# Patient Record
Sex: Male | Born: 1975 | Race: Black or African American | Hispanic: No | Marital: Married | Smoking: Never smoker
Health system: Southern US, Community
[De-identification: ages and names within clinical notes are randomized; demographics above are authoritative.]

## PROBLEM LIST (undated history)

## (undated) DIAGNOSIS — J45909 Unspecified asthma, uncomplicated: Secondary | ICD-10-CM

---

## 2016-02-29 ENCOUNTER — Encounter (HOSPITAL_COMMUNITY): Payer: Self-pay | Admitting: *Deleted

## 2016-02-29 ENCOUNTER — Emergency Department (HOSPITAL_COMMUNITY)
Admission: EM | Admit: 2016-02-29 | Discharge: 2016-02-29 | Disposition: A | Discharge status: Home / Self Care | Payer: Medicaid Other | Attending: Emergency Medicine | Admitting: Emergency Medicine

## 2016-02-29 DIAGNOSIS — J3489 Other specified disorders of nose and nasal sinuses: Secondary | ICD-10-CM | POA: Diagnosis present

## 2016-02-29 DIAGNOSIS — R197 Diarrhea, unspecified: Secondary | ICD-10-CM | POA: Diagnosis not present

## 2016-02-29 DIAGNOSIS — R42 Dizziness and giddiness: Secondary | ICD-10-CM | POA: Insufficient documentation

## 2016-02-29 DIAGNOSIS — J019 Acute sinusitis, unspecified: Secondary | ICD-10-CM | POA: Insufficient documentation

## 2016-02-29 DIAGNOSIS — J45909 Unspecified asthma, uncomplicated: Secondary | ICD-10-CM | POA: Diagnosis not present

## 2016-02-29 DIAGNOSIS — R110 Nausea: Secondary | ICD-10-CM | POA: Diagnosis not present

## 2016-02-29 HISTORY — DX: Unspecified asthma, uncomplicated: J45.909

## 2016-02-29 MED ORDER — IBUPROFEN 800 MG PO TABS
800.0000 mg | ORAL_TABLET | Freq: Once | ORAL | Status: AC
  Administered 2016-02-29: 800 mg via ORAL
  Filled 2016-02-29: qty 1

## 2016-02-29 MED ORDER — ONDANSETRON 4 MG PO TBDP
4.0000 mg | ORAL_TABLET | Freq: Once | ORAL | Status: AC
  Administered 2016-02-29: 4 mg via ORAL
  Filled 2016-02-29: qty 1

## 2016-02-29 MED ORDER — DEXAMETHASONE 4 MG PO TABS
10.0000 mg | ORAL_TABLET | Freq: Once | ORAL | Status: AC
  Administered 2016-02-29: 10 mg via ORAL
  Filled 2016-02-29: qty 3

## 2016-02-29 MED ORDER — PROMETHAZINE HCL 25 MG PO TABS: 25.0000 mg | ORAL_TABLET | Freq: Four times a day (QID) | ORAL | Status: DC | PRN

## 2016-02-29 MED ORDER — FLUTICASONE PROPIONATE 50 MCG/ACT NA SUSP: 2.0000 | Freq: Every day | NASAL | Status: AC

## 2016-02-29 MED ORDER — IBUPROFEN 800 MG PO TABS: 800.0000 mg | ORAL_TABLET | Freq: Three times a day (TID) | ORAL | Status: DC | PRN

## 2016-02-29 MED ORDER — CETIRIZINE HCL 10 MG PO CAPS: 10.0000 mg | ORAL_CAPSULE | Freq: Every day | ORAL | Status: AC

## 2016-02-29 NOTE — ED Notes (Signed)
Sinus pressure eyes and ears draining today  From the pollen  He has had benadryl

## 2016-02-29 NOTE — ED Provider Notes (Signed)
By signing my name below, I, Budd Palmer, attest that this documentation has been prepared under the direction and in the presence of [employer], DO. Electronically Signed: Budd Palmer, ED Scribe. 02/29/2016. 3:47 AM.  TIME SEEN: 3:40 AM  CHIEF COMPLAINT: Seasonal Allergies  HPI: Christian Ellis is a 40 y.o. male with a PMHx of asthma who presents to the Emergency Department complaining of seasonal allergies flare up onset today. He reports associated nausea, diarrhea, and sinus pressure above the eyes, as well as dizziness. He also endorses increased discharge from the eyes, as well as rhinorrhea, and ear drainage. He states he typically takes sinus drainage pills for this, but notes that this has not provided any relief. He has also taken a daytime benadryl today without any effect. He is not on any daily medication for his sinuses. Pt denies vomiting, fever, and chills. No neck pain or neck stiffness. No rash. No shortness of breath. No cough.  ROS: See HPI Constitutional: no fever  Eyes: drainage  ENT: runny nose   Cardiovascular:  no chest pain  Resp: no SOB  GI: no vomiting GU: no dysuria Integumentary: no rash  Allergy: no hives  Musculoskeletal: no leg swelling  Neurological: no slurred speech ROS otherwise negative  PAST MEDICAL HISTORY/PAST SURGICAL HISTORY:  Past Medical History  Diagnosis Date  . Asthma     MEDICATIONS:  Prior to Admission medications   Not on File    ALLERGIES:  No Known Allergies  SOCIAL HISTORY:  Social History  Substance Use Topics  . Smoking status: Never Smoker   . Smokeless tobacco: Not on file  . Alcohol Use: No    FAMILY HISTORY: No family history on file.  EXAM: BP 132/94 mmHg  Pulse 100  Temp(Src) 100 F (37.8 C)  Resp 16  Ht 5' 10.5" (1.791 m)  Wt 231 lb 3 oz (104.866 kg)  BMI 32.69 kg/m2  SpO2 95% CONSTITUTIONAL: Alert and oriented and responds appropriately to questions. Well-appearing; well-nourished,  Low-grade temperature of 100, nontoxic, smiling, pleasant HEAD: Normocephalic EYES: Conjunctivae clear, PERRL ENT: normal nose; no rhinorrhea; moist mucous membranes; No pharyngeal erythema or petechiae, no tonsillar hypertrophy or exudate, no uvular deviation, no trismus or drooling, normal phonation, no stridor, no dental caries or abscess noted, no Ludwig's angina, tongue sits flat in the bottom of the mouth; TMs are clear bilaterally without erythema, purulence, bulging, perforation, effusion.  No cerumen impaction or sign of foreign body in the external auditory canal. No inflammation, erythema or drainage from the external auditory canal. No signs of mastoiditis. No pain with manipulation of the pinna bilaterally. NECK: Supple, no meningismus, no LAD  CARD: RRR; S1 and S2 appreciated; no murmurs, no clicks, no rubs, no gallops RESP: Normal chest excursion without splinting or tachypnea; breath sounds clear and equal bilaterally; no wheezes, no rhonchi, no rales, no hypoxia or respiratory distress, speaking full sentences ABD/GI: Normal bowel sounds; non-distended; soft, non-tender, no rebound, no guarding, no peritoneal signs BACK:  The back appears normal and is non-tender to palpation, there is no CVA tenderness EXT: Normal ROM in all joints; non-tender to palpation; no edema; normal capillary refill; no cyanosis, no calf tenderness or swelling    SKIN: Normal color for age and race; warm; no rash NEURO: Moves all extremities equally, sensation to light touch intact diffusely, cranial nerves II through XII intact PSYCH: The patient's mood and manner are appropriate. Grooming and personal hygiene are appropriate.  MEDICAL DECISION MAKING: Patient here  with sinusitis versus upper respiratory infection. No sign of bacterial infection on exam. Doubt pneumonia or meningitis. No sign of deep space neck infection. No sign of pharyngitis, otitis media. I do not feel he needs to be on antibiotics. We'll  discharge him with medication for symptomatic relief. Given Decadron in the emergency department. We'll discharge with ibuprofen, Phenergan, Flonase, Zyrtec. Advised him to continue using Benadryl as needed. He reports this frequent happens to him with there is increase of pollen. Abdominal exam is benign. He is having nausea and diarrhea but appears well hydrated. Doubt appendicitis, colitis, bowel obstruction, diverticulitis, pancreatitis, cholecystitis. Discussed return precautions. Given outpatient PCP follow-up information.   At this time, I do not feel there is any life-threatening condition present. I have reviewed and discussed all results (EKG, imaging, lab, urine as appropriate), exam findings with patient. I have reviewed nursing notes and appropriate previous records.  I feel the patient is safe to be discharged home without further emergent workup. Discussed usual and customary return precautions. Patient and family (if present) verbalize understanding and are comfortable with this plan.  Patient will follow-up with their primary care provider. If they do not have a primary care provider, information for follow-up has been provided to them. All questions have been answered.  I personally performed the services described in this documentation, which was scribed in my presence. The recorded information has been reviewed and is accurate.   Layla MawKristen N Kista Robb, DO 02/29/16 [phone]

## 2016-02-29 NOTE — Discharge Instructions (Signed)
If you develop fever of 100.4 higher you may alternate between Tylenol 1000 mg every 6 hours as needed for fever and pain and ibuprofen 800 mg every 8 hours as needed for fever and pain. Your symptoms today may be secondary to seasonal allergies versus the beginning of a viral illness. Viruses do not need antibiotics. A viral illness may take 7-10 days to run its course. I recommend increased rest, fluid intake.  Sinusitis, Adult Sinusitis is redness, soreness, and inflammation of the paranasal sinuses. Paranasal sinuses are air pockets within the bones of your face. They are located beneath your eyes, in the middle of your forehead, and above your eyes. In healthy paranasal sinuses, mucus is able to drain out, and air is able to circulate through them by way of your nose. However, when your paranasal sinuses are inflamed, mucus and air can become trapped. This can allow bacteria and other germs to grow and cause infection. Sinusitis can develop quickly and last only a short time (acute) or continue over a long period (chronic). Sinusitis that lasts for more than 12 weeks is considered chronic. CAUSES Causes of sinusitis include:  Allergies.  Structural abnormalities, such as displacement of the cartilage that separates your nostrils (deviated septum), which can decrease the air flow through your nose and sinuses and affect sinus drainage.  Functional abnormalities, such as when the small hairs (cilia) that line your sinuses and help remove mucus do not work properly or are not present. SIGNS AND SYMPTOMS Symptoms of acute and chronic sinusitis are the same. The primary symptoms are pain and pressure around the affected sinuses. Other symptoms include:  Upper toothache.  Earache.  Headache.  Bad breath.  Decreased sense of smell and taste.  A cough, which worsens when you are lying flat.  Fatigue.  Fever.  Thick drainage from your nose, which often is green and may contain pus  (purulent).  Swelling and warmth over the affected sinuses. DIAGNOSIS Your health care provider will perform a physical exam. During your exam, your health care provider may perform any of the following to help determine if you have acute sinusitis or chronic sinusitis:  Look in your nose for signs of abnormal growths in your nostrils (nasal polyps).  Tap over the affected sinus to check for signs of infection.  View the inside of your sinuses using an imaging device that has a light attached (endoscope). If your health care provider suspects that you have chronic sinusitis, one or more of the following tests may be recommended:  Allergy tests.  Nasal culture. A sample of mucus is taken from your nose, sent to a lab, and screened for bacteria.  Nasal cytology. A sample of mucus is taken from your nose and examined by your health care provider to determine if your sinusitis is related to an allergy. TREATMENT Most cases of acute sinusitis are related to a viral infection and will resolve on their own within 10 days. Sometimes, medicines are prescribed to help relieve symptoms of both acute and chronic sinusitis. These may include pain medicines, decongestants, nasal steroid sprays, or saline sprays. However, for sinusitis related to a bacterial infection, your health care provider will prescribe antibiotic medicines. These are medicines that will help kill the bacteria causing the infection. Rarely, sinusitis is caused by a fungal infection. In these cases, your health care provider will prescribe antifungal medicine. For some cases of chronic sinusitis, surgery is needed. Generally, these are cases in which sinusitis recurs more than 3  times per year, despite other treatments. HOME CARE INSTRUCTIONS  Drink plenty of water. Water helps thin the mucus so your sinuses can drain more easily.  Use a humidifier.  Inhale steam 3-4 times a day (for example, sit in the bathroom with the shower  running).  Apply a warm, moist washcloth to your face 3-4 times a day, or as directed by your health care provider.  Use saline nasal sprays to help moisten and clean your sinuses.  Take medicines only as directed by your health care provider.  If you were prescribed either an antibiotic or antifungal medicine, finish it all even if you start to feel better. SEEK IMMEDIATE MEDICAL CARE IF:  You have increasing pain or severe headaches.  You have nausea, vomiting, or drowsiness.  You have swelling around your face.  You have vision problems.  You have a stiff neck.  You have difficulty breathing.   This information is not intended to replace advice given to you by your health care provider. Make sure you discuss any questions you have with your health care provider.   Document Released: 11/10/2005 Document Revised: 12/01/2014 Document Reviewed: 11/25/2011 Elsevier Interactive Patient Education [employer]2016 Elsevier Inc.   To find a primary care or specialty doctor please call [phone] or [phone] to access "[place] Find a Doctor Service."  You may also go on the [facility] website at [email or web address]  There are also multiple [facility] practices throughout the [place] that are frequently accepting new patients. You may find a clinic that is close to your home and contact them.  [facility] -  [address] [place] [id number] [phone]  [facility] in [place] (also locations in [place] and [place]) -  [address] [place] [address] [phone]  [facility] -  [address] [place] [address] [phone]

## 2016-04-16 ENCOUNTER — Emergency Department (HOSPITAL_COMMUNITY)
Admission: EM | Admit: 2016-04-16 | Discharge: 2016-04-16 | Disposition: A | Discharge status: Home / Self Care | Payer: Medicaid Other | Attending: Emergency Medicine | Admitting: Emergency Medicine

## 2016-04-16 ENCOUNTER — Encounter (HOSPITAL_COMMUNITY): Payer: Self-pay | Admitting: Emergency Medicine

## 2016-04-16 ENCOUNTER — Emergency Department (HOSPITAL_COMMUNITY): Payer: Medicaid Other

## 2016-04-16 DIAGNOSIS — Z79899 Other long term (current) drug therapy: Secondary | ICD-10-CM | POA: Diagnosis not present

## 2016-04-16 DIAGNOSIS — Z791 Long term (current) use of non-steroidal anti-inflammatories (NSAID): Secondary | ICD-10-CM | POA: Diagnosis not present

## 2016-04-16 DIAGNOSIS — J189 Pneumonia, unspecified organism: Secondary | ICD-10-CM | POA: Insufficient documentation

## 2016-04-16 DIAGNOSIS — J45909 Unspecified asthma, uncomplicated: Secondary | ICD-10-CM | POA: Diagnosis not present

## 2016-04-16 DIAGNOSIS — R05 Cough: Secondary | ICD-10-CM | POA: Diagnosis present

## 2016-04-16 MED ORDER — BENZONATATE 100 MG PO CAPS: 100.0000 mg | ORAL_CAPSULE | Freq: Three times a day (TID) | ORAL | Status: AC

## 2016-04-16 MED ORDER — AZITHROMYCIN 250 MG PO TABS: 250.0000 mg | ORAL_TABLET | Freq: Every day | ORAL | Status: AC

## 2016-04-16 MED ORDER — ALBUTEROL SULFATE HFA 108 (90 BASE) MCG/ACT IN AERS: 1.0000 | INHALATION_SPRAY | Freq: Four times a day (QID) | RESPIRATORY_TRACT | Status: AC | PRN

## 2016-04-16 MED ORDER — OXYCODONE-ACETAMINOPHEN 5-325 MG PO TABS
1.0000 | ORAL_TABLET | Freq: Once | ORAL | Status: AC
  Administered 2016-04-16: 1 via ORAL
  Filled 2016-04-16: qty 1

## 2016-04-16 MED ORDER — IBUPROFEN 800 MG PO TABS: 800.0000 mg | ORAL_TABLET | Freq: Three times a day (TID) | ORAL | Status: AC

## 2016-04-16 NOTE — ED Notes (Addendum)
cough and back pain started 2 days ago congested cough and hurts to breath and cough states son and wife have had resp bugs

## 2016-04-16 NOTE — ED Provider Notes (Signed)
CSN: [id number]     Arrival date & time 04/16/16  0815 History   First MD Initiated Contact with Patient 04/16/16 0825     Chief Complaint  Patient presents with  . Cough     (Consider location/radiation/quality/duration/timing/severity/associated sxs/prior Treatment) Patient is a 40 y.o. male presenting with cough. The history is provided by the patient and medical records.  Cough   40 year old male with history of asthma, presenting to the ED for cough. He reports this began 2 days ago. Has overall been dry and nonproductive. He states he has pain in his left ribs with coughing and deep breathing. He reports he had a fever yesterday, has not checked today. He reports his wife and grandson have also recently been sick with URI type symptoms as well. Patient denies any chest pain or shortness of breath. No cardiac history. Never been a smoker. He has not had any medications prior to arrival today.  VSS.  Past Medical History  Diagnosis Date  . Asthma    No past surgical history on file. No family history on file. Social History  Substance Use Topics  . Smoking status: Never Smoker   . Smokeless tobacco: None  . Alcohol Use: No    Review of Systems  Respiratory: Positive for cough.   All other systems reviewed and are negative.     Allergies  Review of patient's allergies indicates no known allergies.  Home Medications   Prior to Admission medications   Medication Sig Start Date End Date Taking? Authorizing Provider  Cetirizine HCl (ZYRTEC ALLERGY) 10 MG CAPS Take 1 capsule (10 mg total) by mouth daily. 02/29/16   Kristen N Ward, DO  fluticasone (FLONASE) 50 MCG/ACT nasal spray Place 2 sprays into both nostrils daily. 02/29/16   Kristen N Ward, DO  ibuprofen (ADVIL,MOTRIN) 800 MG tablet Take 1 tablet (800 mg total) by mouth every 8 (eight) hours as needed for mild pain. 02/29/16   Kristen N Ward, DO  promethazine (PHENERGAN) 25 MG tablet Take 1 tablet (25 mg total) by mouth  every 6 (six) hours as needed for nausea or vomiting. 02/29/16   Kristen N Ward, DO   BP 129/91 mmHg  Pulse 104  Temp(Src) 99 F (37.2 C) (Oral)  Resp 20  SpO2 99%   Physical Exam  Constitutional: He is oriented to person, place, and time. He appears well-developed and well-nourished.  HENT:  Head: Normocephalic and atraumatic.  Mouth/Throat: Oropharynx is clear and moist.  Eyes: Conjunctivae and EOM are normal. Pupils are equal, round, and reactive to light.  Neck: Normal range of motion.  Cardiovascular: Normal rate, regular rhythm and normal heart sounds.   Pulmonary/Chest: Effort normal and breath sounds normal. He has no wheezes. He has no rhonchi.  Reports pain in left ribs with coughing, no bruising or deformities noted; lungs overall clear bilaterally  Abdominal: Soft. Bowel sounds are normal.  Musculoskeletal: Normal range of motion.  Neurological: He is alert and oriented to person, place, and time.  Skin: Skin is warm and dry.  Psychiatric: He has a normal mood and affect.  Nursing note and vitals reviewed.   ED Course  Procedures (including critical care time) Labs Review Labs Reviewed - No data to display  Imaging Review Dg Chest 2 View  04/16/2016  CLINICAL DATA:  40 year old male with cough for 3 days. Fever for 1 day. Left rib pain now with coughing. Initial encounter. EXAM: CHEST  2 VIEW COMPARISON:  None. FINDINGS: Low lung volumes.  No pleural effusion or consolidation. There is streaky and confluent left lower lobe opacity. Normal cardiac size and mediastinal contours. Visualized tracheal air column is within normal limits. No pneumothorax or pulmonary edema. Gas distended but nondilated colon in the upper abdomen. No acute osseous abnormality identified. IMPRESSION: Left lower lobe bronchopneumonia/pneumonia. No pleural effusion. Somewhat low lung volumes. Followup PA and lateral chest X-ray is recommended in 3-4 weeks following trial of antibiotic therapy to  ensure resolution and exclude underlying malignancy. Electronically Signed   By: Odessa FlemingH  Hall M.D.   On: 04/16/2016 09:03   I have personally reviewed and evaluated these images and lab results as part of my medical decision-making.   EKG Interpretation None      MDM   Final diagnoses:  CAP (community acquired pneumonia)   40 year old male here with cough and left rib pain with coughing. Denies chest pain or shortness of breath. No known cardiac history. He is afebrile, nontoxic. He endorses pain in the left side of his ribs but there is no deformity or bruising noted on exam. His lungs are overall clear. Chest x-ray was obtained which reveals a left lower lobe pneumonia. Patient remains hemodynamically stable here in the emergency department. Patient young, overall healthy, no significant pulmonary comorbidities. Feel he is stable for outpatient management. Will start on azithromycin. Patient was also given prescriptions for Tessalon, albuterol inhaler, and motrin if needed.  FU with PCP.  Discussed plan with patient, he/she acknowledged understanding and agreed with plan of care.  Return precautions given for new or worsening symptoms.  Garlon HatchetLisa M Reba Hulett, PA-C 04/16/16 1002  Arby BarretteMarcy Pfeiffer, MD 04/17/16 [phone]

## 2016-04-16 NOTE — ED Notes (Signed)
Pt states he understands instructions.Home with wife

## 2016-04-16 NOTE — Discharge Instructions (Signed)
Take the prescribed medication as directed. Follow-up with your primary care doctor. Return to the ED for new or worsening symptoms.  Community-Acquired Pneumonia, Adult Pneumonia is an infection of the lungs. There are different types of pneumonia. One type can develop while a person is in a hospital. A different type, called community-acquired pneumonia, develops in people who are not, or have not recently been, in the hospital or other health care facility.  CAUSES Pneumonia may be caused by bacteria, viruses, or funguses. Community-acquired pneumonia is often caused by Streptococcus pneumonia bacteria. These bacteria are often passed from one person to another by breathing in droplets from the cough or sneeze of an infected person. RISK FACTORS The condition is more likely to develop in:  People who havechronic diseases, such as chronic obstructive pulmonary disease (COPD), asthma, congestive heart failure, cystic fibrosis, diabetes, or kidney disease.  People who haveearly-stage or late-stage HIV.  People who havesickle cell disease.  People who havehad their spleen removed (splenectomy).  People who havepoor Administratordental hygiene.  People who havemedical conditions that increase the risk of breathing in (aspirating) secretions their own mouth and nose.   People who havea weakened immune system (immunocompromised).  People who smoke.  People whotravel to areas where pneumonia-causing germs commonly exist.  People whoare around animal habitats or animals that have pneumonia-causing germs, including birds, bats, rabbits, cats, and farm animals. SYMPTOMS Symptoms of this condition include:  Adry cough.  A wet (productive) cough.  Fever.  Sweating.  Chest pain, especially when breathing deeply or coughing.  Rapid breathing or difficulty breathing.  Shortness of breath.  Shaking chills.  Fatigue.  Muscle aches. DIAGNOSIS Your health care provider will take a  medical history and perform a physical exam. You may also have other tests, including:  Imaging studies of your chest, including X-rays.  Tests to check your blood oxygen level and other blood gases.  Other tests on blood, mucus (sputum), fluid around your lungs (pleural fluid), and urine. If your pneumonia is severe, other tests may be done to identify the specific cause of your illness. TREATMENT The type of treatment that you receive depends on many factors, such as the cause of your pneumonia, the medicines you take, and other medical conditions that you have. For most adults, treatment and recovery from pneumonia may occur at home. In some cases, treatment must happen in a hospital. Treatment may include:  Antibiotic medicines, if the pneumonia was caused by bacteria.  Antiviral medicines, if the pneumonia was caused by a virus.  Medicines that are given by mouth or through an IV tube.  Oxygen.  Respiratory therapy. Although rare, treating severe pneumonia may include:  Mechanical ventilation. This is done if you are not breathing well on your own and you cannot maintain a safe blood oxygen level.  Thoracentesis. This procedureremoves fluid around one lung or both lungs to help you breathe better. HOME CARE INSTRUCTIONS  Take over-the-counter and prescription medicines only as told by your health care provider.  Only takecough medicine if you are losing sleep. Understand that cough medicine can prevent your body's natural ability to remove mucus from your lungs.  If you were prescribed an antibiotic medicine, take it as told by your health care provider. Do not stop taking the antibiotic even if you start to feel better.  Sleep in a semi-upright position at night. Try sleeping in a reclining chair, or place a few pillows under your head.  Do not use tobacco products, including cigarettes,  chewing tobacco, and e-cigarettes. If you need help quitting, ask your health care  provider.  Drink enough water to keep your urine clear or pale yellow. This will help to thin out mucus secretions in your lungs. PREVENTION There are ways that you can decrease your risk of developing community-acquired pneumonia. Consider getting a pneumococcal vaccine if:  You are older than 38 years of age.  You are older than 38 years of age and are undergoing cancer treatment, have chronic lung disease, or have other medical conditions that affect your immune system. Ask your health care provider if this applies to you. There are different types and schedules of pneumococcal vaccines. Ask your health care provider which vaccination option is best for you. You may also prevent community-acquired pneumonia if you take these actions:  Get an influenza vaccine every year. Ask your health care provider which type of influenza vaccine is best for you.  Go to the dentist on a regular basis.  Wash your hands often. Use hand sanitizer if soap and water are not available. SEEK MEDICAL CARE IF:  You have a fever.  You are losing sleep because you cannot control your cough with cough medicine. SEEK IMMEDIATE MEDICAL CARE IF:  You have worsening shortness of breath.  You have increased chest pain.  Your sickness becomes worse, especially if you are an older adult or have a weakened immune system.  You cough up blood.   This information is not intended to replace advice given to you by your health care provider. Make sure you discuss any questions you have with your health care provider.   Document Released: 11/10/2005 Document Revised: 08/01/2015 Document Reviewed: 03/07/2015 Elsevier Interactive Patient Education [employer]

## 2016-04-16 NOTE — ED Notes (Signed)
Patient transported to X-ray 

## 2016-06-09 IMAGING — DX DG CHEST 2V
2 series · 2 of 2 positions shown · non-contrast
Comparison: None.

CLINICAL DATA: 39-year-old male with cough for 3 days. Fever for 1
day. Left rib pain now with coughing. Initial encounter.

EXAM:
CHEST  2 VIEW

[w chest pa]
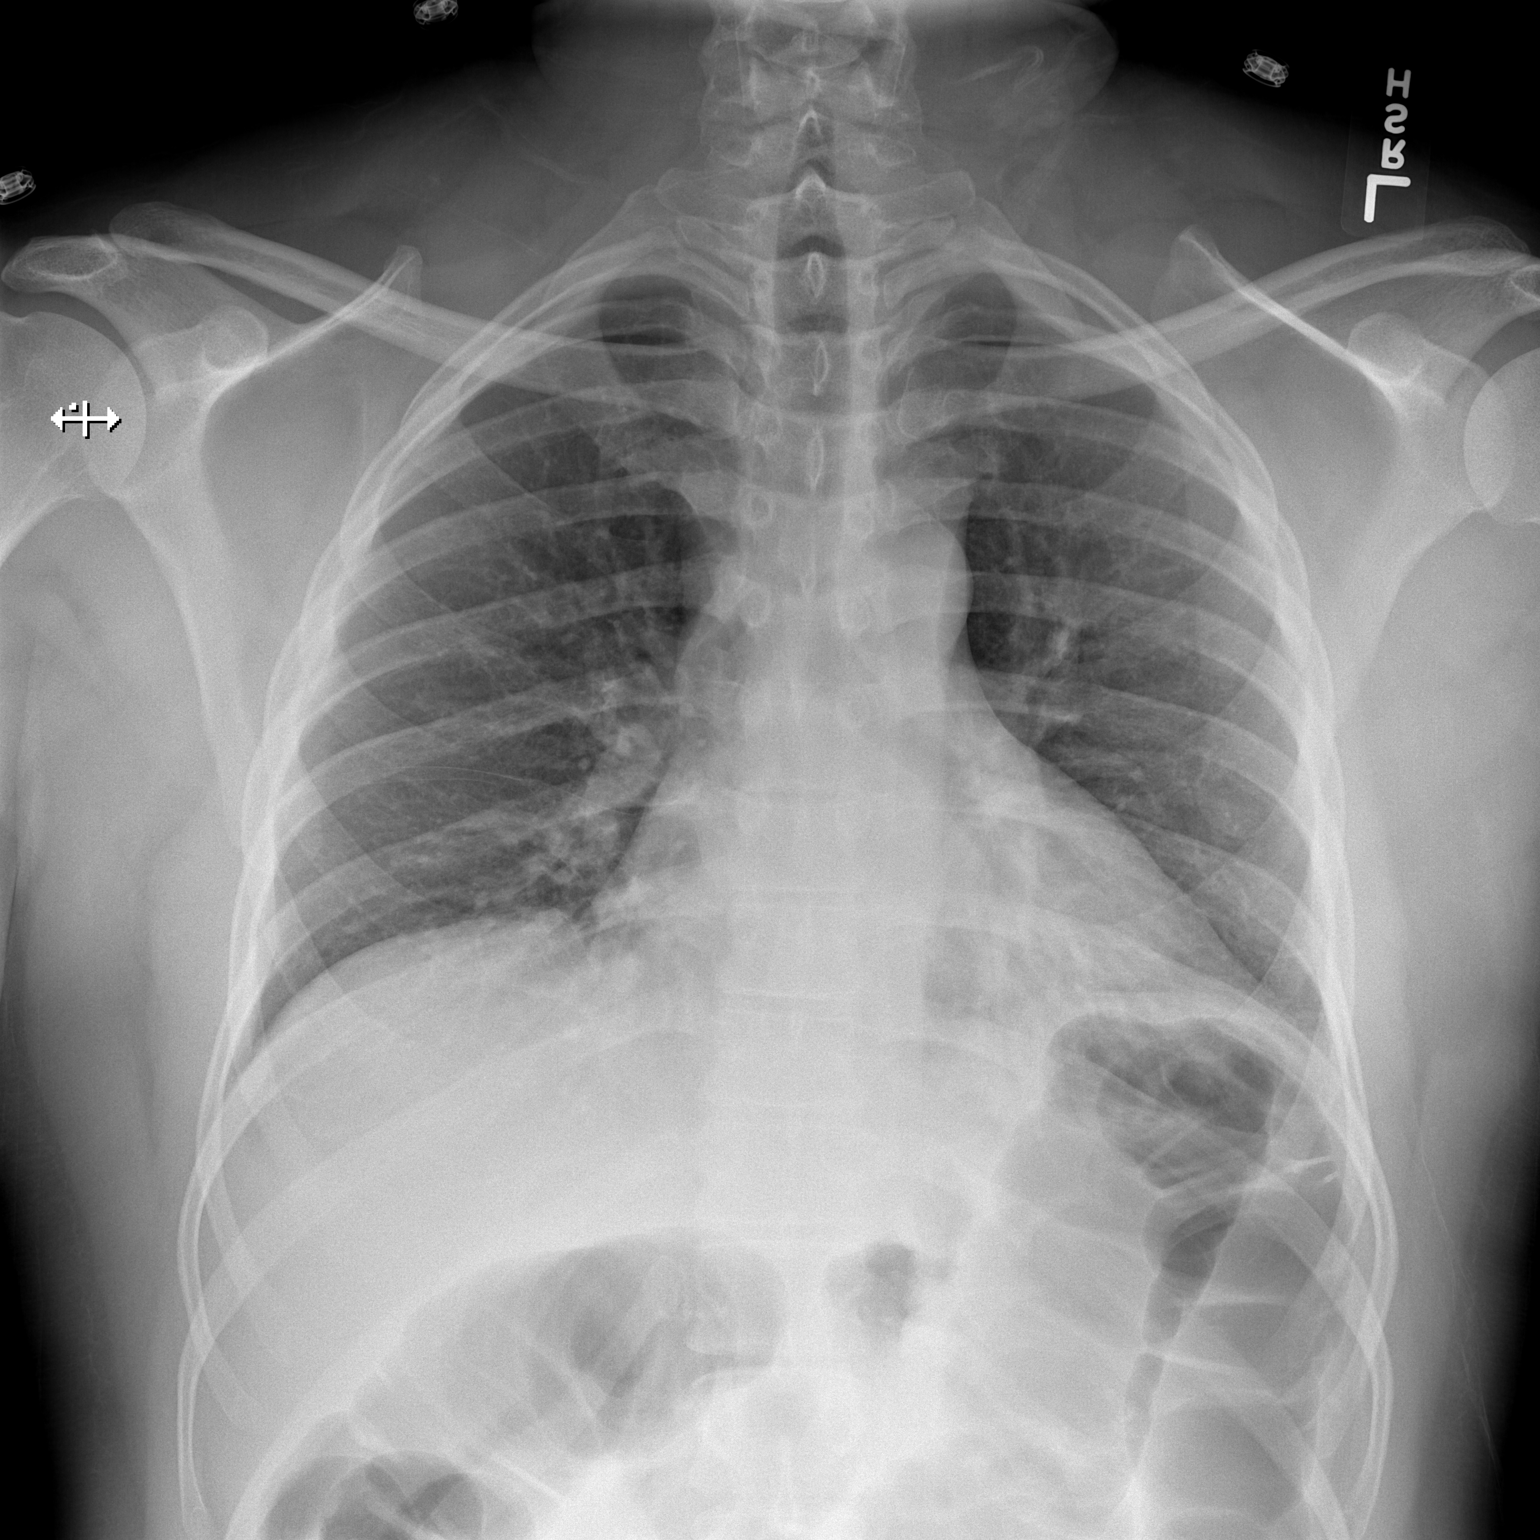

[w chest lat]
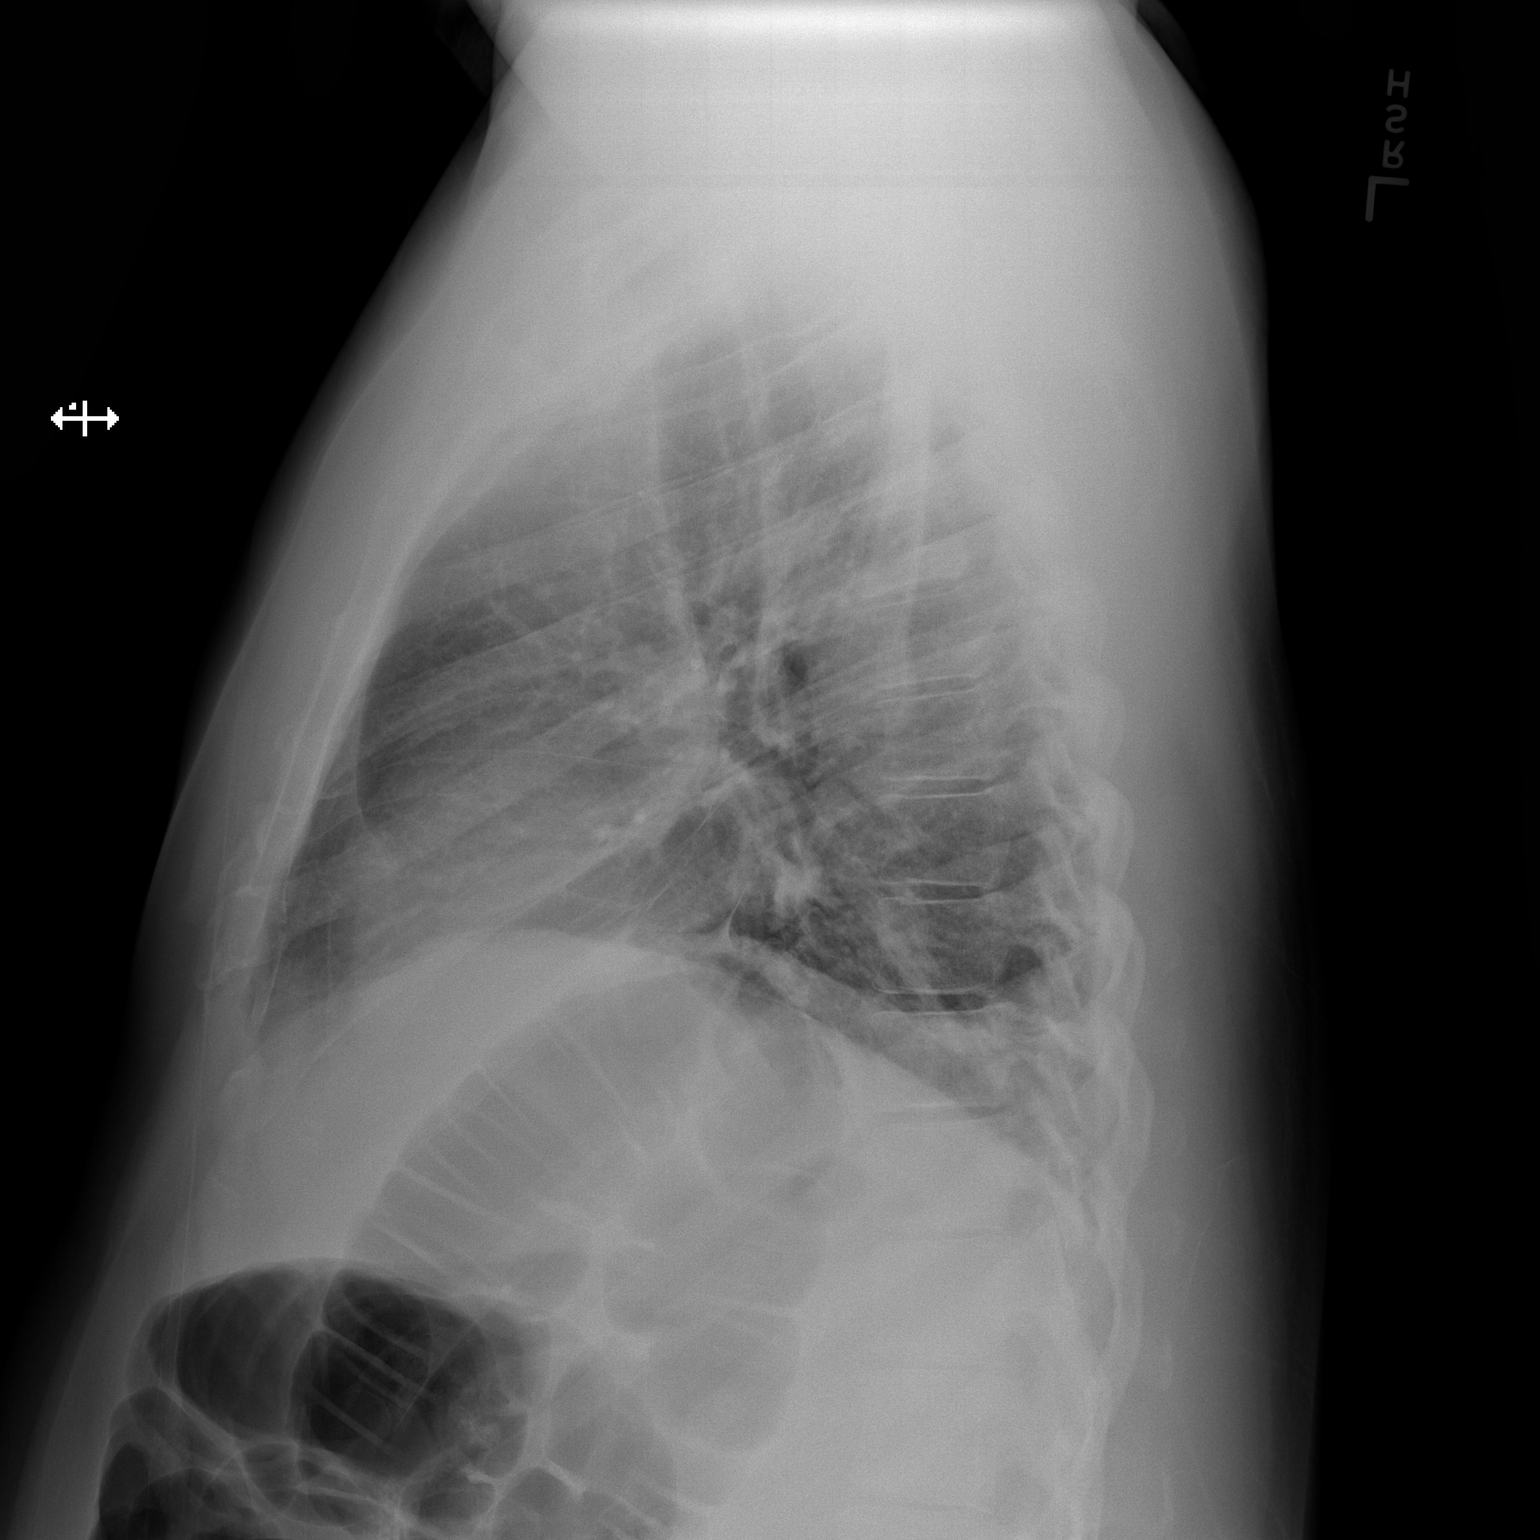

[2 of 2 positions shown; findings below may reference images not displayed]

FINDINGS: Low lung volumes. No pleural effusion or consolidation. There is
streaky and confluent left lower lobe opacity. Normal cardiac size
and mediastinal contours. Visualized tracheal air column is within
normal limits. No pneumothorax or pulmonary edema. Gas distended but
nondilated colon in the upper abdomen. No acute osseous abnormality
identified.
IMPRESSION: Left lower lobe bronchopneumonia/pneumonia. No pleural effusion.
Somewhat low lung volumes.

Followup PA and lateral chest X-ray is recommended in 3-4 weeks
following trial of antibiotic therapy to ensure resolution and
exclude underlying malignancy.
